# Patient Record
Sex: Male | Born: 1970 | Race: White | Hispanic: No | Marital: Single | Smoking: Current every day smoker
Health system: Southern US, Community
[De-identification: ages and names within clinical notes are randomized; demographics above are authoritative.]

## PROBLEM LIST (undated history)

## (undated) DIAGNOSIS — I10 Essential (primary) hypertension: Secondary | ICD-10-CM

## (undated) DIAGNOSIS — J939 Pneumothorax, unspecified: Secondary | ICD-10-CM

## (undated) DIAGNOSIS — T148XXA Other injury of unspecified body region, initial encounter: Secondary | ICD-10-CM

## (undated) HISTORY — PX: CHEST TUBE INSERTION: SHX231

---

## 2004-06-08 ENCOUNTER — Emergency Department (HOSPITAL_COMMUNITY): Admission: EM | Admit: 2004-06-08 | Discharge: 2004-06-08 | Payer: Self-pay | Admitting: Emergency Medicine

## 2005-04-18 ENCOUNTER — Emergency Department (HOSPITAL_COMMUNITY): Admission: EM | Admit: 2005-04-18 | Discharge: 2005-04-18 | Payer: Self-pay | Admitting: Emergency Medicine

## 2006-08-24 ENCOUNTER — Emergency Department (HOSPITAL_COMMUNITY): Admission: EM | Admit: 2006-08-24 | Discharge: 2006-08-25 | Payer: Self-pay | Admitting: Emergency Medicine

## 2007-06-23 ENCOUNTER — Encounter
Admission: RE | Admit: 2007-06-23 | Discharge: 2007-06-23 | Payer: Self-pay | Admitting: Physical Medicine & Rehabilitation

## 2007-11-13 ENCOUNTER — Emergency Department (HOSPITAL_COMMUNITY): Admission: EM | Admit: 2007-11-13 | Discharge: 2007-11-13 | Payer: Self-pay | Admitting: Family Medicine

## 2009-12-23 ENCOUNTER — Emergency Department (HOSPITAL_COMMUNITY): Admission: EM | Admit: 2009-12-23 | Discharge: 2009-12-23 | Payer: Self-pay | Admitting: Emergency Medicine

## 2017-05-23 ENCOUNTER — Encounter (INDEPENDENT_AMBULATORY_CARE_PROVIDER_SITE_OTHER): Payer: Self-pay

## 2017-05-23 ENCOUNTER — Encounter: Payer: Self-pay | Admitting: Neurology

## 2017-05-23 ENCOUNTER — Ambulatory Visit: Payer: Medicaid Other | Admitting: Neurology

## 2017-05-23 VITALS — BP 191/109 | HR 51 | Ht 72.0 in | Wt 221.0 lb

## 2017-05-23 DIAGNOSIS — IMO0002 Reserved for concepts with insufficient information to code with codable children: Secondary | ICD-10-CM

## 2017-05-23 DIAGNOSIS — G43709 Chronic migraine without aura, not intractable, without status migrainosus: Secondary | ICD-10-CM | POA: Diagnosis not present

## 2017-05-23 DIAGNOSIS — G8929 Other chronic pain: Secondary | ICD-10-CM | POA: Diagnosis not present

## 2017-05-23 DIAGNOSIS — M542 Cervicalgia: Secondary | ICD-10-CM

## 2017-05-23 MED ORDER — NORTRIPTYLINE HCL 25 MG PO CAPS: 50.0000 mg | ORAL_CAPSULE | Freq: Every day | ORAL | 11 refills | Status: DC

## 2017-05-23 MED ORDER — SUMATRIPTAN SUCCINATE 100 MG PO TABS: 100.0000 mg | ORAL_TABLET | Freq: Once | ORAL | 11 refills | Status: DC | PRN

## 2017-05-23 MED ORDER — TIZANIDINE HCL 4 MG PO TABS: 4.0000 mg | ORAL_TABLET | Freq: Four times a day (QID) | ORAL | 6 refills | Status: DC | PRN
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
## 2017-08-02 ENCOUNTER — Encounter (HOSPITAL_COMMUNITY): Admission: RE | Payer: Self-pay | Source: Ambulatory Visit

## 2017-08-02 ENCOUNTER — Ambulatory Visit (HOSPITAL_COMMUNITY): Admission: RE | Admit: 2017-08-02 | Payer: Medicaid Other | Source: Ambulatory Visit | Admitting: Oral Surgery

## 2017-08-02 SURGERY — DENTAL RESTORATION/EXTRACTIONS
Anesthesia: General | Laterality: Bilateral

## 2017-08-22 ENCOUNTER — Telehealth: Payer: Self-pay | Admitting: *Deleted

## 2017-08-22 ENCOUNTER — Ambulatory Visit: Payer: Medicaid Other | Admitting: Neurology

## 2017-08-22 NOTE — Telephone Encounter (Signed)
No showed follow up appointment. 

## 2017-08-26 ENCOUNTER — Encounter: Payer: Self-pay | Admitting: Neurology

## 2017-09-18 NOTE — Progress Notes (Signed)
Called pt for pre-op call. He stated that he is planning to cancel the surgery, doesn't want all the teeth pulled at one time. I instructed him to call Dr. Randa EvensJensen's office today.   EKG tracing from Dr. Unice BaileyEksir's office requested.  I've left a message for Sam, surgery scheduler at Dr. Randa EvensJensen's office to call me

## 2017-09-18 NOTE — Progress Notes (Signed)
Samantha at Dr. Randa EvensJensen's office returned my call, made her aware that pt was planning on cancelling. She states she will contact pt herself.

## 2017-09-18 NOTE — Progress Notes (Signed)
Kyle Mahoney Kyle Mahoney again at Dr. Randa EvensJensen's office to see if she had gotten in touch with [name]. She states she has tried with no luck. Still not sure if he's really cancelling surgery.

## 2017-09-19 ENCOUNTER — Encounter (HOSPITAL_COMMUNITY): Payer: Self-pay | Admitting: Certified Registered Nurse Anesthetist

## 2017-09-19 NOTE — Progress Notes (Signed)
Pt stated that he is not having surgery and the dentist is aware.

## 2017-09-20 ENCOUNTER — Ambulatory Visit (HOSPITAL_COMMUNITY): Admission: RE | Admit: 2017-09-20 | Payer: Medicaid Other | Source: Ambulatory Visit | Admitting: Oral Surgery

## 2017-09-20 SURGERY — DENTAL RESTORATION/EXTRACTIONS
Anesthesia: General | Laterality: Bilateral

## 2018-02-01 ENCOUNTER — Other Ambulatory Visit: Payer: Self-pay

## 2018-02-01 ENCOUNTER — Emergency Department (HOSPITAL_COMMUNITY)
Admission: EM | Admit: 2018-02-01 | Discharge: 2018-02-01 | Disposition: A | Discharge status: Home / Self Care | Payer: Medicaid Other | Attending: Emergency Medicine | Admitting: Emergency Medicine

## 2018-02-01 ENCOUNTER — Encounter (HOSPITAL_COMMUNITY): Payer: Self-pay | Admitting: Pharmacy Technician

## 2018-02-01 ENCOUNTER — Emergency Department (HOSPITAL_COMMUNITY): Payer: Medicaid Other

## 2018-02-01 DIAGNOSIS — I10 Essential (primary) hypertension: Secondary | ICD-10-CM | POA: Insufficient documentation

## 2018-02-01 DIAGNOSIS — F17200 Nicotine dependence, unspecified, uncomplicated: Secondary | ICD-10-CM | POA: Diagnosis not present

## 2018-02-01 DIAGNOSIS — R600 Localized edema: Secondary | ICD-10-CM | POA: Insufficient documentation

## 2018-02-01 DIAGNOSIS — M549 Dorsalgia, unspecified: Secondary | ICD-10-CM | POA: Insufficient documentation

## 2018-02-01 DIAGNOSIS — Z79899 Other long term (current) drug therapy: Secondary | ICD-10-CM | POA: Diagnosis not present

## 2018-02-01 DIAGNOSIS — G5631 Lesion of radial nerve, right upper limb: Secondary | ICD-10-CM | POA: Insufficient documentation

## 2018-02-01 DIAGNOSIS — M542 Cervicalgia: Secondary | ICD-10-CM | POA: Insufficient documentation

## 2018-02-01 DIAGNOSIS — R0602 Shortness of breath: Secondary | ICD-10-CM | POA: Diagnosis not present

## 2018-02-01 DIAGNOSIS — R609 Edema, unspecified: Secondary | ICD-10-CM

## 2018-02-01 HISTORY — DX: Essential (primary) hypertension: I10

## 2018-02-01 LAB — CBC WITH DIFFERENTIAL/PLATELET
ABS IMMATURE GRANULOCYTES: 0.03 10*3/uL (ref 0.00–0.07)
BASOS PCT: 0 %
Basophils Absolute: 0 10*3/uL (ref 0.0–0.1)
EOS ABS: 0.1 10*3/uL (ref 0.0–0.5)
Eosinophils Relative: 1 %
HEMATOCRIT: 48.3 % (ref 39.0–52.0)
Hemoglobin: 16.4 g/dL (ref 13.0–17.0)
Immature Granulocytes: 0 %
Lymphocytes Relative: 18 %
Lymphs Abs: 1.6 10*3/uL (ref 0.7–4.0)
MCH: 29.9 pg (ref 26.0–34.0)
MCHC: 34 g/dL (ref 30.0–36.0)
MCV: 88 fL (ref 80.0–100.0)
MONO ABS: 0.6 10*3/uL (ref 0.1–1.0)
Monocytes Relative: 7 %
Neutro Abs: 6.3 10*3/uL (ref 1.7–7.7)
Neutrophils Relative %: 74 %
PLATELETS: 236 10*3/uL (ref 150–400)
RBC: 5.49 MIL/uL (ref 4.22–5.81)
RDW: 11.8 % (ref 11.5–15.5)
WBC: 8.7 10*3/uL (ref 4.0–10.5)
nRBC: 0 % (ref 0.0–0.2)

## 2018-02-01 LAB — COMPREHENSIVE METABOLIC PANEL
ALBUMIN: 3.8 g/dL (ref 3.5–5.0)
ALK PHOS: 110 U/L (ref 38–126)
ALT: 30 U/L (ref 0–44)
AST: 24 U/L (ref 15–41)
Anion gap: 9 (ref 5–15)
BUN: 13 mg/dL (ref 6–20)
CALCIUM: 9.5 mg/dL (ref 8.9–10.3)
CO2: 27 mmol/L (ref 22–32)
Chloride: 104 mmol/L (ref 98–111)
Creatinine, Ser: 1.06 mg/dL (ref 0.61–1.24)
GFR calc Af Amer: >60 mL/min (ref >60)
GFR calc non Af Amer: >60 mL/min (ref >60)
GLUCOSE: 124 mg/dL — AB (ref 70–99)
Potassium: 4 mmol/L (ref 3.5–5.1)
Sodium: 140 mmol/L (ref 135–145)
TOTAL PROTEIN: 6.8 g/dL (ref 6.5–8.1)
Total Bilirubin: 0.8 mg/dL (ref 0.3–1.2)

## 2018-02-01 LAB — BRAIN NATRIURETIC PEPTIDE: B Natriuretic Peptide: 90.7 pg/mL (ref 0.0–100.0)

## 2018-02-01 NOTE — Discharge Instructions (Addendum)
Elevate your legs above your heart as much as possible.  No evidence of heart failure, kidney failure or liver failure from today's tests. call to schedule an appointment with your primary care provider if leg swelling not improved in a week or if the ability to bend your wrist is not improved in 1 to 2 weeks.  Ask your primary care provider to help you to stop smoking

## 2018-02-01 NOTE — ED Provider Notes (Signed)
[facility] EMERGENCY DEPARTMENT Provider Note   CSN: [id number] Arrival date & time: 02/01/18  1645     History   Chief Complaint Chief Complaint  Patient presents with  . Leg Swelling    HPI Kyle Mahoney is a 47 y.o. male Complains of bilateral leg swelling intermittently for the past 30 to 45 days.  Swelling is worse when he has his feet in a dependent position and improved with elevating his legs.  He also reports intermittent shortness of breath over weeks.[name]  He denies shortness of breath presently..  Denies any chest pain.  No other associated symptoms.  He does suffer from chronic back pain from several months ago when he "bent over to pick up a log" he treated himself with oxycodone from time to time which is prescribed for him chronic neck pain he reports that he sleeps with one pillow and sometimes sleeps in a recliner, reporting that he just falls asleep late at night sometimes HPI  Past Medical History:  Diagnosis Date  . Hypertension     Patient Active Problem List   Diagnosis Date Noted  . Chronic neck pain 05/23/2017  . Chronic migraine 05/23/2017    History reviewed. No pertinent surgical history.      Home Medications    Prior to Admission medications   Medication Sig Start Date End Date Taking? Authorizing Provider  ALPRAZolam (XANAX) 0.5 MG tablet TAKE 1 TABLET BY MOUTH AT NIGHT AS NEEDED FOR SLEEP 04/17/17   [provider]  amLODipine (NORVASC) 10 MG tablet Take 10 mg by mouth daily.    [provider]  metoprolol (TOPROL-XL) 200 MG 24 hr tablet Take 200 mg by mouth daily. 09/04/17   [provider]  nortriptyline (PAMELOR) 25 MG capsule Take 2 capsules (50 mg total) by mouth at bedtime. Patient not taking: Reported on 09/16/2017 05/23/17   Levert Feinstein, MD  oxyCODONE-acetaminophen (PERCOCET) 10-325 MG tablet Take 1 tablet by mouth daily as needed for pain.  08/21/17   [provider]  SUMAtriptan  (IMITREX) 100 MG tablet Take 1 tablet (100 mg total) by mouth once as needed for up to 1 dose for migraine. May repeat in 2 hours if headache persists or recurs. Patient not taking: Reported on 09/16/2017 05/23/17   Levert Feinstein, MD  tiZANidine (ZANAFLEX) 4 MG tablet Take 1 tablet (4 mg total) by mouth every 6 (six) hours as needed for muscle spasms. Patient not taking: Reported on 09/16/2017 05/23/17   Levert Feinstein, MD    Family History No family history on file.  Social History Social History   Tobacco Use  . Smoking status: Current Every Day Smoker    Packs/day: 1.00  . Smokeless tobacco: Never Used  Substance Use Topics  . Alcohol use: Yes  . Drug use: Never   Drinks at least 1/5 of liquor per week  Allergies   Patient has no known allergies.   Review of Systems Review of Systems  Constitutional: Negative.   HENT: Negative.   Respiratory: Positive for shortness of breath.        No shortness of breath presently  Cardiovascular: Positive for leg swelling.  Gastrointestinal: Negative.   Musculoskeletal: Positive for back pain and neck pain.       Chronic neck neck and back pain  Skin: Negative.   Neurological: Negative.   Psychiatric/Behavioral: Negative.   All other systems reviewed and are negative.    Physical Exam Updated Vital Signs BP [name])  158/96   Pulse 79   Temp (!) 97.5 F (36.4 C) (Oral)   Resp 20   Ht 5\' 10"  (1.778 m)   Wt 90.7 kg   SpO2 100%   BMI 28.70 kg/m   Physical Exam Vitals signs and nursing note reviewed.  Constitutional:      General: He is not in acute distress.    Appearance: He is well-developed.  HENT:     Head: Normocephalic and atraumatic.     Comments: Poor dentition diffusely Eyes:     Conjunctiva/sclera: Conjunctivae normal.     Pupils: Pupils are equal, round, and reactive to light.  Neck:     Musculoskeletal: Neck supple.     Thyroid: No thyromegaly.     Vascular: No carotid bruit.     Trachea: No tracheal deviation.      Comments: No JVD Cardiovascular:     Rate and Rhythm: Normal rate and regular rhythm.     Heart sounds: No murmur.  Pulmonary:     Effort: Pulmonary effort is normal. No respiratory distress.     Breath sounds: Normal breath sounds.  Abdominal:     General: Bowel sounds are normal. There is no distension.     Palpations: Abdomen is soft.     Tenderness: There is no abdominal tenderness.  Musculoskeletal: Normal range of motion.        General: No tenderness.     Comments: Trace to 1+ pretibial pitting edema bilaterally DP pulses 2+ bilaterally  Skin:    General: Skin is warm and dry.     Findings: No rash.  Neurological:     Mental Status: He is alert and oriented to person, place, and time.     Coordination: Coordination normal.     Results for orders placed or performed during the hospital encounter of 02/01/18  Comprehensive metabolic panel  Result Value Ref Range   Sodium 140 135 - 145 mmol/L   Potassium 4.0 3.5 - 5.1 mmol/L   Chloride 104 98 - 111 mmol/L   CO2 27 22 - 32 mmol/L   Glucose, Bld 124 (H) 70 - 99 mg/dL   BUN 13 6 - 20 mg/dL   Creatinine, Ser 9.601.06 0.61 - 1.24 mg/dL   Calcium 9.5 8.9 - 45.410.3 mg/dL   Total Protein 6.8 6.5 - 8.1 g/dL   Albumin 3.8 3.5 - 5.0 g/dL   AST 24 15 - 41 U/L   ALT 30 0 - 44 U/L   Alkaline Phosphatase 110 38 - 126 U/L   Total Bilirubin 0.8 0.3 - 1.2 mg/dL   GFR calc non Af Amer >60 >60 mL/min   GFR calc Af Amer >60 >60 mL/min   Anion gap 9 5 - 15  CBC with Differential/Platelet  Result Value Ref Range   WBC 8.7 4.0 - 10.5 K/uL   RBC 5.49 4.22 - 5.81 MIL/uL   Hemoglobin 16.4 13.0 - 17.0 g/dL   HCT 09.848.3 11.939.0 - 14.752.0 %   MCV 88.0 80.0 - 100.0 fL   MCH 29.9 26.0 - 34.0 pg   MCHC 34.0 30.0 - 36.0 g/dL   RDW 82.911.8 56.211.5 - 13.015.5 %   Platelets 236 150 - 400 K/uL   nRBC 0.0 0.0 - 0.2 %   Neutrophils Relative % 74 %   Neutro Abs 6.3 1.7 - 7.7 K/uL   Lymphocytes Relative 18 %   Lymphs Abs 1.6 0.7 - 4.0 K/uL   Monocytes Relative 7 %  Monocytes Absolute 0.6 0.1 - 1.0 K/uL   Eosinophils Relative 1 %   Eosinophils Absolute 0.1 0.0 - 0.5 K/uL   Basophils Relative 0 %   Basophils Absolute 0.0 0.0 - 0.1 K/uL   Immature Granulocytes 0 %   Abs Immature Granulocytes 0.03 0.00 - 0.07 K/uL  Brain natriuretic peptide  Result Value Ref Range   B Natriuretic Peptide 90.7 0.0 - 100.0 pg/mL   Dg Chest 2 View  Result Date: 02/01/2018 CLINICAL DATA:  Bilateral lower extremity swelling and pain. EXAM: CHEST - 2 VIEW COMPARISON:  None. FINDINGS: Lungs are hyperexpanded. The lungs are clear without focal pneumonia, edema, pneumothorax or pleural effusion. The cardiopericardial silhouette is within normal limits for size. The visualized bony structures of the thorax are intact. IMPRESSION: Hyperexpansion without acute findings. Electronically Signed   By: Kennith CenterEric  Mansell M.D.   On: 02/01/2018 18:13   ED Treatments / Results  Labs (all labs ordered are listed, but only abnormal results are displayed) Labs Reviewed - No data to display  EKG None  Radiology No results found.  Procedures Procedures (including critical care time)  Medications Ordered in ED Medications - No data to display   Initial Impression / Assessment and Plan / ED Course  I have reviewed the triage vital signs and the nursing notes.  Pertinent labs & imaging results that were available during my care of the patient were reviewed by me and considered in my medical decision making (see chart for details).   Lab work unremarkable.  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  I suggest he follow-up with PMD I will feel the patient requires diuretics.  Peripheral leg edema is minor.  No signs of congestive heart  failure, renal failure or hepatic failure.  I suggest elevation of legs.  I also counseled patient for 5 minutes on smoking cessation Final Clinical Impressions(s) / ED Diagnoses  #1 peripheral edema #2 radial nerve palsy #3 tobacco abuse Final diagnoses:  None    ED Discharge Orders    None       Doug SouJacubowitz, Brianne Maina, MD 02/01/18 1907

## 2018-02-01 NOTE — ED Triage Notes (Signed)
Pt arrives via pov with reports of back pain, BLE edema and sob. 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 Pt states over the last 3 weeks his ankles are swelling and he has been SOB while sitting.

## 2018-02-01 NOTE — ED Notes (Signed)
Patient verbalizes understanding of discharge instructions. Opportunity for questioning and answers were provided. 

## 2018-02-06 ENCOUNTER — Other Ambulatory Visit (HOSPITAL_COMMUNITY): Payer: Self-pay | Admitting: Family Medicine

## 2018-02-06 ENCOUNTER — Ambulatory Visit (HOSPITAL_COMMUNITY)
Admission: RE | Admit: 2018-02-06 | Discharge: 2018-02-06 | Disposition: A | Discharge status: Home / Self Care | Payer: Medicaid Other | Source: Ambulatory Visit | Attending: Family Medicine | Admitting: Family Medicine

## 2018-02-06 DIAGNOSIS — M79604 Pain in right leg: Secondary | ICD-10-CM

## 2018-02-06 DIAGNOSIS — M79605 Pain in left leg: Secondary | ICD-10-CM

## 2018-02-06 DIAGNOSIS — M7989 Other specified soft tissue disorders: Secondary | ICD-10-CM | POA: Diagnosis present

## 2018-02-06 NOTE — Progress Notes (Signed)
Right lower extremity venous duplex has been completed. Negative for DVT. [facility] Bailey[facility]ksir's office.  02/06/18 11:03 AM Olen CordialGreg Avarae Zwart RVT

## 2018-04-07 ENCOUNTER — Encounter: Payer: Self-pay | Admitting: Cardiology

## 2018-04-07 ENCOUNTER — Ambulatory Visit (INDEPENDENT_AMBULATORY_CARE_PROVIDER_SITE_OTHER): Payer: Medicaid Other | Admitting: Cardiology

## 2018-04-07 ENCOUNTER — Ambulatory Visit: Payer: Self-pay | Admitting: Cardiology

## 2018-04-07 VITALS — BP 129/73 | HR 63 | Ht 70.0 in | Wt 209.0 lb

## 2018-04-07 DIAGNOSIS — R600 Localized edema: Secondary | ICD-10-CM | POA: Diagnosis not present

## 2018-04-07 DIAGNOSIS — I10 Essential (primary) hypertension: Secondary | ICD-10-CM

## 2018-04-07 DIAGNOSIS — R0609 Other forms of dyspnea: Secondary | ICD-10-CM

## 2018-04-07 DIAGNOSIS — I493 Ventricular premature depolarization: Secondary | ICD-10-CM

## 2018-04-07 DIAGNOSIS — F17200 Nicotine dependence, unspecified, uncomplicated: Secondary | ICD-10-CM

## 2018-04-07 NOTE — Progress Notes (Signed)
Subjective:   Kyle Mahoney, male    DOB: Jun 25, 1970, 48 y.o.   MRN: [id number]  Roger KillWilliams, Breejante J, PA-C:  Chief Complaint  Patient presents with  . Palpitations    HPI: Kyle MasterJoseph A Cato  is a 48 y.o. male  with hypertension, tobacco use disorder recently evaluated by us for palpitations, chest pain, and leg edema. He underwent lexiscan nuclear stress test, echocardiogram, and event monitor and now presents for follow up.   Leg edema was felt to be related to amlodipine and has improved; however, he continues to have intermittent leg swelling. Since last seen by us, he has not noticed any significant chest pain and palpitations have slightly improved, but does continue to have occasional episodes where he feels that his heart is racing. Dyspnea on exertion is unchanged.   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 His arm function is slowly improving. He has not had anymore syncopal episodes and he feels that he did not actually pass out.   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  He does currently smoke 1/2 pack per day, working to quit. Previously smoked 2 packs per day since 2005. He does occasionally drink alcohol. 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  Past Medical History:  Diagnosis Date  . Hypertension     History reviewed. No pertinent surgical history.  History reviewed. No pertinent family history.  Social History   Socioeconomic History  . Marital status: Single    Spouse name: Not on file  . Number of children: Not on file  . Years of education: Not on file  . Highest education level: Not on file  Occupational History  . Not on file  Social Needs  . Financial resource strain: Not on file  . Food insecurity:    Worry: Not on file    Inability: Not on file  . Transportation needs:    Medical: Not on file    Non-medical: Not on file  Tobacco Use  . Smoking status: Current Every Day Smoker    Packs/day: 1.00  . Smokeless tobacco: Never Used  Substance and Sexual Activity  . Alcohol use: Yes  . Drug use: Never  . Sexual activity: Not on file  Lifestyle  . Physical activity:    Days per week: Not on file    Minutes per session: Not on file  . Stress: Not on file  Relationships  . Social connections:    Talks on phone: Not on file    Gets together: Not on file    Attends religious service: Not on file    Active member of club or organization: Not on file    Attends meetings of clubs or organizations: Not on file    Relationship status: Not on file  . Intimate partner violence:    Fear of current or ex partner: Not on file    Emotionally abused: Not on file    Physically abused: Not on file    Forced sexual activity: Not on file  Other Topics Concern  . Not on file  Social History Narrative  . Not on file    Current Meds  Medication Sig  . ALPRAZolam (XANAX) 0.5 MG tablet TAKE 1 TABLET BY MOUTH AT NIGHT AS NEEDED FOR SLEEP  .  cyclobenzaprine (FLEXERIL) 10 MG tablet Take 1 tablet by mouth daily.  . furosemide (LASIX) 20 MG tablet Take 1-2 tablets by mouth daily.  [name] lisinopril (PRINIVIL,ZESTRIL) 40 MG tablet Take 40 mg by mouth daily.  . metoprolol (TOPROL-XL) 200 MG 24 hr tablet Take 200 mg by mouth daily.  . MULTIPLE VITAMIN PO Take 1 tablet by mouth daily.  [name] oxyCODONE-acetaminophen (PERCOCET) 10-325 MG tablet Take 1 tablet by mouth daily as needed for pain.      Review of Systems  Constitution: Negative for decreased appetite, malaise/fatigue, weight gain and weight loss.  Eyes: Negative for visual disturbance.  Cardiovascular: Positive for leg swelling (occasional) and palpitations (occasional). Negative for chest pain, claudication, orthopnea and syncope.  Respiratory: Positive for shortness of breath. Negative for hemoptysis and wheezing.     Endocrine: Negative for cold intolerance and heat intolerance.  Hematologic/Lymphatic: Does not bruise/bleed easily.  Skin: Negative for nail changes.  Musculoskeletal: Negative for muscle weakness and myalgias.  Gastrointestinal: Negative for abdominal pain, change in bowel habit, nausea and vomiting.  Neurological: Negative for difficulty with concentration, dizziness, focal weakness and headaches.  Psychiatric/Behavioral: Negative for altered mental status and suicidal ideas.  All other systems reviewed and are negative.      Objective:     Blood pressure 129/73, pulse 63, height 5\' 10"  (1.778 m), weight 209 lb (94.8 kg), SpO2 100 %.  Echocardiogram 02/24/2018: Left ventricle cavity is normal in size. Mild concentric hypertrophy of the left ventricle. Normal global wall motion. Normal diastolic filling pattern. Calculated EF 63%. Mild tricuspid regurgitation. Estimated pulmonary artery systolic pressure 36 mmHg.  Lexiscan myoview stress test 02/24/2018: 1. Lexiscan stress test was performed. Exercise capacity was not assessed. Stress symptoms included chest pain, dizziness, headache, and palpitations. Peak effect blood pressure was 142/80 mmHg. Stress EKG is non diagnostic for ischemia as it is a pharmacologic stress. In addition, the stress electrocardiogram showed sinus tachycardia, normal stress conduction, frequent PVC's, and normal stress repolarization.  2. The overall quality of the study is good. There is no evidence of abnormal lung activity. Stress and rest SPECT images demonstrate homogeneous tracer distribution throughout the myocardium. Gated SPECT imaging reveals normal myocardial thickening and wall motion. The left ventricular ejection fraction was normal (66%).  3. Intermediate risk study due to frequent PVC's seen through out the study. Clinical correlation recommendation.  30 day event monitor 02/14/2018-03/15/2018: Normal sinus rhythm.  Symptoms correlated with  normal sinus rhythm with occasional PVCs. 4 episodes were sinus rhythm with trigeminal PVC's.  No A. fib or SVT occurred.  Physical Exam  Constitutional: He is oriented to person, place, and time. Vital signs are normal. He appears well-developed and well-nourished.  HENT:  Head: Normocephalic and atraumatic.  Neck: Normal range of motion.  Cardiovascular: Normal rate, regular rhythm, normal heart sounds and intact distal pulses.  Occasional extrasystoles are present.  Pulmonary/Chest: Effort normal and breath sounds normal. No accessory muscle usage. No respiratory distress.  Abdominal: Soft. Bowel sounds are normal.  Musculoskeletal: Normal range of motion.  Neurological: He is alert and oriented to person, place, and time.  Skin: Skin is warm and dry.  Vitals reviewed.          Assessment & Recommendations:   1. PVC (premature ventricular contraction) Event monitor results were discussed with the patient, he was noted to have occasional PVCs.  He also had a couple of episodes of trigeminal PVCs that correlated with his symptoms of flutter/rapid heartbeat.  He is noted  to have occasional ectopy on exam today. He is on high dose beta blocker. I have discussed life threatening vs life altering. Etiology for PVC's were discussed. Encouraged him to avoid caffeine, controlling stress, regular exercise, etc. I will check a TSH level as this does not appear to have been performed for possible etiology.   2. Tobacco use disorder Tobacco use cessation:  Cigarette smoke contains a deadly mix of more than 7,000 chemicals; hundreds are toxic and about 70 can cause cancer. Cigarette smoke can cause serious health problems, numerous diseases, and death. Apart from reducing cancer and COPD deaths, Smoking cessation reduces the risk for coronary heart disease, stroke, and peripheral vascular disease. Coronary heart disease risk is substantially reduced within 1 to 2 years of cessation. Patient is  currently working to quit and will try to continue to work towards this on his own. Will further discuss medications at his next office visit.   3. Essential hypertension Blood pressure is elevated today, but generally well controlled. Will continue to monitor. Discussed avoiding salt in his diet.   4. Bilateral leg edema Has improved since stopping amlodipine; however, he continues to have intermittent episodes. Echocardiogram results were discussed with the patient, no systolic or diastolic dysfunction. Has mildly elevated PA pressure by echo that is likely related to tobacco use and underlying COPD. I cannot explain is intermittent episodes of swelling. I have asked him to pay attention to when leg swelling occurs as to what he has eating prior to this to see if this is related. No evidence of venous insufficiency and symptoms are not suggestive of this.   5. Dyspnea on exertion Echo and stress test results were discussed. No evidence of ischemia by stress test. I suspect that dyspnea is related to ongoing tobacco use and deconditioning. Encouraged him to start regular cardio exercises and also continue to work on smoking cessation to see if symptoms will improve.    I will notify him of TSH results. I will see him back in 3 months to follow up on PVC's, dyspnea on exertion, and tobacco cessation.    Altamese Ryegate, FNP-C [facility] Office: [phone] Fax: [phone]

## 2018-04-12 ENCOUNTER — Encounter: Payer: Self-pay | Admitting: Cardiology

## 2018-04-12 DIAGNOSIS — F17200 Nicotine dependence, unspecified, uncomplicated: Secondary | ICD-10-CM | POA: Insufficient documentation

## 2018-04-12 DIAGNOSIS — I493 Ventricular premature depolarization: Secondary | ICD-10-CM | POA: Insufficient documentation

## 2018-04-12 DIAGNOSIS — R0609 Other forms of dyspnea: Secondary | ICD-10-CM | POA: Insufficient documentation

## 2018-04-12 DIAGNOSIS — I10 Essential (primary) hypertension: Secondary | ICD-10-CM | POA: Insufficient documentation

## 2018-04-12 DIAGNOSIS — R600 Localized edema: Secondary | ICD-10-CM | POA: Insufficient documentation

## 2018-07-08 ENCOUNTER — Ambulatory Visit: Payer: Medicaid Other | Admitting: Cardiology

## 2018-08-06 NOTE — Progress Notes (Deleted)
Primary Physician:  Roger KillWilliams, Breejante J, PA-C   Patient ID: Kyle MasterJoseph A Boxwell, male    DOB: 02/21/1970, 48 y.o.   MRN: [id number]  Subjective:    No chief complaint on file.   HPI: Kyle Mahoney  is a 48 y.o. male  with hypertension, tobacco use disorder with occasional PVC's and leg edema. Underwent lexiscan nuclear stress test in Jan 2020 that was considered intermediate risk in view of frequent PVC's. 30 day event monitor showed correlation of symptoms of palpitations with occasional PVC's. Echocardiogram also performed in Jan 2020 was essentially normal.   This is a 3 month office visit. Leg edema improved with stopping amlodipine and also being careful with his diet. He does continue to have occasional palpitations. TSH is normal.   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 His arm function is slowly improving. He has not had anymore syncopal episodes and he feels that he did not actually pass out.   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  He does currently smoke 1/2 pack per day, working to quit. Previously smoked 2 packs per day since 2005. He does occasionally drink alcohol. 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  Past Medical History:  Diagnosis Date  . Hypertension     No past surgical history on file.  Social History   Socioeconomic History  . Marital status: Single    Spouse name: Not on file  . Number of children: Not on file  . Years of education: Not on file  . Highest education level: Not on file  Occupational History  . Not on file  Social Needs  . Financial resource strain: Not on file  . Food insecurity    Worry: Not on file    Inability: Not on file  . Transportation needs    Medical: Not on file    Non-medical: Not on file  Tobacco Use  . Smoking status: Current Every Day  Smoker    Packs/day: 1.00  . Smokeless tobacco: Never Used  Substance and Sexual Activity  . Alcohol use: Yes  . Drug use: Never  . Sexual activity: Not on file  Lifestyle  . Physical activity    Days per week: Not on file    Minutes per session: Not on file  . Stress: Not on file  Relationships  . Social Musicianconnections    Talks on phone: Not on file    Gets together: Not on file    Attends religious service: Not on file    Active member of club or organization: Not on file    Attends meetings of clubs or organizations: Not on file    Relationship status: Not on file  . Intimate partner violence    Fear of current or ex partner: Not on file    Emotionally abused: Not on file    Physically abused: Not on file    Forced sexual activity: Not on file  Other Topics Concern  . Not on file  Social History Narrative  . Not on file    Review of Systems  Constitution: Negative for decreased appetite, malaise/fatigue, weight gain and weight loss.  Eyes: Negative for visual disturbance.  Cardiovascular: Positive for leg swelling (occasional) and palpitations (occasional). Negative for chest pain, claudication, orthopnea and syncope.  Respiratory:  Positive for shortness of breath. Negative for hemoptysis and wheezing.   Endocrine: Negative for cold intolerance and heat intolerance.  Hematologic/Lymphatic: Does not bruise/bleed easily.  Skin: Negative for nail changes.  Musculoskeletal: Negative for muscle weakness and myalgias.  Gastrointestinal: Negative for abdominal pain, change in bowel habit, nausea and vomiting.  Neurological: Negative for difficulty with concentration, dizziness, focal weakness and headaches.  Psychiatric/Behavioral: Negative for altered mental status and suicidal ideas.  All other systems reviewed and are negative.     Objective:  There were no vitals taken for this visit. There is no height or weight on file to calculate BMI.    Physical Exam   Constitutional: He is oriented to person, place, and time. Vital signs are normal. He appears well-developed and well-nourished.  HENT:  Head: Normocephalic and atraumatic.  Neck: Normal range of motion.  Cardiovascular: Normal rate, regular rhythm, normal heart sounds and intact distal pulses.  Occasional extrasystoles are present.  Pulmonary/Chest: Effort normal and breath sounds normal. No accessory muscle usage. No respiratory distress.  Abdominal: Soft. Bowel sounds are normal.  Musculoskeletal: Normal range of motion.  Neurological: He is alert and oriented to person, place, and time.  Skin: Skin is warm and dry.  Vitals reviewed.  Radiology: No results found.  Laboratory examination:    CMP Latest Ref Rng & Units 02/01/2018  Glucose 70 - 99 mg/dL 124(H)  BUN 6 - 20 mg/dL 13  Creatinine 0.61 - 1.24 mg/dL 1.06  Sodium 135 - 145 mmol/L 140  Potassium 3.5 - 5.1 mmol/L 4.0  Chloride 98 - 111 mmol/L 104  CO2 22 - 32 mmol/L 27  Calcium 8.9 - 10.3 mg/dL 9.5  Total Protein 6.5 - 8.1 g/dL 6.8  Total Bilirubin 0.3 - 1.2 mg/dL 0.8  Alkaline Phos 38 - 126 U/L 110  AST 15 - 41 U/L 24  ALT 0 - 44 U/L 30   CBC Latest Ref Rng & Units 02/01/2018  WBC 4.0 - 10.5 K/uL 8.7  Hemoglobin 13.0 - 17.0 g/dL 16.4  Hematocrit 39.0 - 52.0 % 48.3  Platelets 150 - 400 K/uL 236   Lipid Panel  No results found for: CHOL, TRIG, HDL, CHOLHDL, VLDL, LDLCALC, LDLDIRECT HEMOGLOBIN A1C No results found for: HGBA1C, MPG TSH No results for input(s): TSH in the last 8760 hours.  PRN Meds:. There are no discontinued medications. No outpatient medications have been marked as taking for the 08/07/18 encounter (Appointment) with Miquel Dunn, NP.    Cardiac Studies:   Echocardiogram 02/24/2018: Left ventricle cavity is normal in size. Mild concentric hypertrophy of the left ventricle. Normal global wall motion. Normal diastolic filling pattern. Calculated EF 63%. Mild tricuspid regurgitation.  Estimated pulmonary artery systolic pressure 36 mmHg.  Lexiscan myoview stress test 02/24/2018: 1. Lexiscan stress test was performed. Exercise capacity was not assessed. Stress symptoms included chest pain, dizziness, headache, and palpitations. Peak effect blood pressure was 142/80 mmHg. Stress EKG is non diagnostic for ischemia as it is a pharmacologic stress. In addition, the stress electrocardiogram showed sinus tachycardia, normal stress conduction, frequent PVC's, and normal stress repolarization.  2. The overall quality of the study is good. There is no evidence of abnormal lung activity. Stress and rest SPECT images demonstrate homogeneous tracer distribution throughout the myocardium. Gated SPECT imaging reveals normal myocardial thickening and wall motion. The left ventricular ejection fraction was normal (66%).  3. Intermediate risk study due to frequent PVC's seen through out the study. Clinical correlation recommendation.  30 day  event monitor 02/14/2018-03/15/2018: Normal sinus rhythm.  Symptoms correlated with normal sinus rhythm with occasional PVCs. 4 episodes were sinus rhythm with trigeminal PVC's.  No A. fib or SVT occurred.  Assessment:   No diagnosis found.  ***  Recommendations:   ***  Toniann FailAshton Haynes Lyndal Reggio, MSN, APRN, FNP-C [facility] [place] Office: [phone] Fax: [phone]

## 2018-08-07 ENCOUNTER — Ambulatory Visit: Payer: Medicaid Other | Admitting: Cardiology

## 2018-09-11 ENCOUNTER — Encounter: Payer: Self-pay | Admitting: Cardiology

## 2018-09-15 ENCOUNTER — Encounter: Payer: Self-pay | Admitting: Cardiology

## 2018-09-15 ENCOUNTER — Other Ambulatory Visit: Payer: Self-pay

## 2018-09-15 ENCOUNTER — Ambulatory Visit (INDEPENDENT_AMBULATORY_CARE_PROVIDER_SITE_OTHER): Payer: Self-pay | Admitting: Cardiology

## 2018-09-15 VITALS — BP 121/78 | HR 71 | Ht 72.0 in | Wt 200.0 lb

## 2018-09-15 DIAGNOSIS — I493 Ventricular premature depolarization: Secondary | ICD-10-CM

## 2018-09-15 DIAGNOSIS — R0609 Other forms of dyspnea: Secondary | ICD-10-CM | POA: Diagnosis not present

## 2018-09-15 DIAGNOSIS — I10 Essential (primary) hypertension: Secondary | ICD-10-CM | POA: Diagnosis not present

## 2018-09-15 DIAGNOSIS — I872 Venous insufficiency (chronic) (peripheral): Secondary | ICD-10-CM

## 2018-09-15 DIAGNOSIS — I739 Peripheral vascular disease, unspecified: Secondary | ICD-10-CM

## 2018-09-15 DIAGNOSIS — F17200 Nicotine dependence, unspecified, uncomplicated: Secondary | ICD-10-CM

## 2018-09-15 NOTE — Progress Notes (Signed)
Primary Physician:  Roger KillWilliams, Breejante J, PA-C   Patient ID: Kyle Mahoney, male    DOB: 1970/04/02, 48 y.o.   MRN: [id number]  Subjective:    Chief Complaint  Patient presents with   Edema   Follow-up    HPI: Kyle Mahoney  is a 48 y.o. male  with hypertension, tobacco use disorder initially evaluated by us for palpitations, chest pain, and leg edema. He underwent lexiscan nuclear stress test in Jan 2020 that showed normal perfusion, frequent PVC's; therefore, considered intermediate risk study. Echocardiogram revealed normal LVEF. Had occasional PVC's on 30 day event monitor.   Since last seen by me 6 months ago, he has had worsening leg edema. Initially felt to be related to amlodipine; however, has continued despite discontinuing this. Leg edema improves with leg elevation and avoiding salt. He has now noticed brown spots on his legs. He does have some tightness and pain in his legs with walking long distances.   He has not noticed any chest discomfort. He does have dyspnea with over exertion that is unchanged. He has started exercising at the gym and has notice heart racing with this and pounding sensation. He has not noticed palpitations outside of trying to exercise. Symptoms resolve with stopping to rest.   He does continue to smoke 0.5 packs per day. Admits to drinking more alcohol recently. States that he has recently had some personal stress that has contributed to this. 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    Past Medical History:  Diagnosis Date   Hypertension     History reviewed. No pertinent surgical history.  Social History   Socioeconomic History   Marital status: Single    Spouse name: Not on file   Number of children: 2   Years of education: Not on file   Highest education level: Not on file  Occupational History   Not on file  Social Needs   Financial resource  strain: Not on file   Food insecurity    Worry: Not on file    Inability: Not on file   Transportation needs    Medical: Not on file    Non-medical: Not on file  Tobacco Use   Smoking status: Current Every Day Smoker    Packs/day: 0.50    Types: Cigarettes   Smokeless tobacco: Never Used  Substance and Sexual Activity   Alcohol use: Yes    Comment: occ   Drug use: Never   Sexual activity: Not on file  Lifestyle   Physical activity    Days per week: Not on file    Minutes per session: Not on file   Stress: Not on file  Relationships   Social connections    Talks on phone: Not on file    Gets together: Not on file    Attends religious service: Not on file    Active member of club or organization: Not on file    Attends meetings of clubs or organizations: Not on file    Relationship status: Not on file   Intimate partner violence    Fear of current or ex partner: Not on file    Emotionally abused: Not on file    Physically abused: Not on file    Forced sexual activity: Not on file  Other Topics Concern   Not on file  Social History Narrative   Not on file  Review of Systems  Constitution: Negative for decreased appetite, malaise/fatigue, weight gain and weight loss.  Eyes: Negative for visual disturbance.  Cardiovascular: Positive for leg swelling (occasional) and palpitations (occasional). Negative for chest pain, claudication, orthopnea and syncope.  Respiratory: Positive for shortness of breath. Negative for hemoptysis and wheezing.   Endocrine: Negative for cold intolerance and heat intolerance.  Hematologic/Lymphatic: Does not bruise/bleed easily.  Skin: Negative for nail changes.  Musculoskeletal: Negative for muscle weakness and myalgias.  Gastrointestinal: Negative for abdominal pain, change in bowel habit, nausea and vomiting.  Neurological: Negative for difficulty with concentration, dizziness, focal weakness and headaches.    Psychiatric/Behavioral: Negative for altered mental status and suicidal ideas.  All other systems reviewed and are negative.     Objective:  Blood pressure 121/78, pulse 71, height 6' (1.829 m), weight 200 lb (90.7 kg), SpO2 100 %. Body mass index is 27.12 kg/m.    Physical Exam  Constitutional: He is oriented to person, place, and time. Vital signs are normal. He appears well-developed and well-nourished.  HENT:  Head: Normocephalic and atraumatic.  Neck: Normal range of motion.  Cardiovascular: Normal rate, regular rhythm, normal heart sounds and intact distal pulses.  Occasional extrasystoles are present.  2+ leg edema bilateral Increased pigmentation  Pulmonary/Chest: Effort normal and breath sounds normal. No accessory muscle usage. No respiratory distress.  Abdominal: Soft. Bowel sounds are normal.  Musculoskeletal: Normal range of motion.  Neurological: He is alert and oriented to person, place, and time.  Skin: Skin is warm and dry.  Vitals reviewed.  Radiology: No results found.  Laboratory examination:    CMP Latest Ref Rng & Units 02/01/2018  Glucose 70 - 99 mg/dL 161(W124(H)  BUN 6 - 20 mg/dL 13  Creatinine 9.600.61 - 4.541.24 mg/dL 0.981.06  Sodium 119135 - 147145 mmol/L 140  Potassium 3.5 - 5.1 mmol/L 4.0  Chloride 98 - 111 mmol/L 104  CO2 22 - 32 mmol/L 27  Calcium 8.9 - 10.3 mg/dL 9.5  Total Protein 6.5 - 8.1 g/dL 6.8  Total Bilirubin 0.3 - 1.2 mg/dL 0.8  Alkaline Phos 38 - 126 U/L 110  AST 15 - 41 U/L 24  ALT 0 - 44 U/L 30   CBC Latest Ref Rng & Units 02/01/2018  WBC 4.0 - 10.5 K/uL 8.7  Hemoglobin 13.0 - 17.0 g/dL 82.916.4  Hematocrit 56.239.0 - 52.0 % 48.3  Platelets 150 - 400 K/uL 236   Lipid Panel  No results found for: CHOL, TRIG, HDL, CHOLHDL, VLDL, LDLCALC, LDLDIRECT HEMOGLOBIN A1C No results found for: HGBA1C, MPG TSH No results for input(s): TSH in the last 8760 hours.  PRN Meds:. Medications Discontinued During This Encounter  Medication Reason   ALPRAZolam  (XANAX) 0.5 MG tablet    cyclobenzaprine (FLEXERIL) 10 MG tablet    lisinopril (PRINIVIL,ZESTRIL) 40 MG tablet    Current Meds  Medication Sig   furosemide (LASIX) 20 MG tablet Take 1-2 tablets by mouth daily.   metoprolol (TOPROL-XL) 200 MG 24 hr tablet Take 200 mg by mouth daily. 1/2 tab daily   MULTIPLE VITAMIN PO Take 1 tablet by mouth daily.   oxyCODONE-acetaminophen (PERCOCET) 10-325 MG tablet Take 1 tablet by mouth daily as needed for pain.     Cardiac Studies:   Echocardiogram 02/24/2018: Left ventricle cavity is normal in size. Mild concentric hypertrophy of the left ventricle. Normal global wall motion. Normal diastolic filling pattern. Calculated EF 63%. Mild tricuspid regurgitation. Estimated pulmonary artery systolic pressure 36 mmHg.  Lexiscan  myoview stress test 02/24/2018: 1. Lexiscan stress test was performed. Exercise capacity was not assessed. Stress symptoms included chest pain, dizziness, headache, and palpitations. Peak effect blood pressure was 142/80 mmHg. Stress EKG is non diagnostic for ischemia as it is a pharmacologic stress. In addition, the stress electrocardiogram showed sinus tachycardia, normal stress conduction, frequent PVC's, and normal stress repolarization.  2. The overall quality of the study is good. There is no evidence of abnormal lung activity. Stress and rest SPECT images demonstrate homogeneous tracer distribution throughout the myocardium. Gated SPECT imaging reveals normal myocardial thickening and wall motion. The left ventricular ejection fraction was normal (66%).  3. Intermediate risk study due to frequent PVC's seen through out the study. Clinical correlation recommendation.  30 day event monitor 02/14/2018-03/15/2018: Normal sinus rhythm.  Symptoms correlated with normal sinus rhythm with occasional PVCs. 4 episodes were sinus rhythm with trigeminal PVC's.  No A. fib or SVT occurred.  Assessment:     ICD-10-CM   1. PVC  (premature ventricular contraction)  I49.3 EKG 12-Lead  2. Venous insufficiency  I87.2   3. Essential hypertension  I10   4. Dyspnea on exertion  R06.09   5. Claudication (HCC)  I73.9 PCV ANKLE BRACHIAL INDEX (ABI)  6. Tobacco use disorder  F17.200     EKG 09/15/2018: Normal sinus rhythm at 61 bpm, normal axis, no evidence of ischemia.    Recommendations:   Patient has had worsening leg edema over the last 6 months and now has bilateral increased pigmentation. Suspect his leg edema is related to venous insufficiency. I have prescribed support stockings to help with this. Dyspnea has remained unchanged. He is on lasix as needed, encouraged him to use daily for the next 3 days. Continue with low sodium diet and leg elevation.   He is not noted to have frequent PVC's on EKG today, but does have occasional on auscultation. He is on high dose beta blocker. He is having heart racing with pushing himself at the gym. Suspect that this may be related to deconditioning. He has previously not been exercising, and has just recently started this. I have encouraged him to obtain heart rate monitor or watch to also help with this. No chest pain. Blood pressure is well controlled.  He does mention pain/ tightness in his legs with walking that resolves with resting. He is a current tobacco user, will obtain ABI for evaluation. I have encouraged him to continue to work to quit smoking. He has been able to cut back on his own in the past. I will continue to monitor his progress. Also advised him to cut back on his alcohol intake. Discussed cardiovascular effects from heavy alcohol use. I will see him back in 8 weeks for follow up.  Miquel Dunn, MSN, APRN, FNP-C [facility]. [place] Office: [phone] Fax: [phone]

## 2018-09-16 ENCOUNTER — Encounter: Payer: Self-pay | Admitting: Cardiology

## 2018-10-09 ENCOUNTER — Other Ambulatory Visit: Payer: Medicaid Other

## 2018-10-10 ENCOUNTER — Other Ambulatory Visit: Payer: Medicaid Other

## 2018-11-10 ENCOUNTER — Ambulatory Visit: Payer: Medicaid Other | Admitting: Cardiology

## 2018-11-10 NOTE — Progress Notes (Deleted)
Primary Physician:  Roger KillWilliams, Breejante J, PA-C   Patient ID: Kyle Mahoney, male    DOB: 1970-03-10, 48 y.o.   MRN: [id number]  Subjective:    No chief complaint on file.   HPI: Kyle Mahoney  is a 48 y.o. male  with hypertension, tobacco use disorder initially evaluated by us for palpitations, chest pain, and leg edema. He underwent lexiscan nuclear stress test in Jan 2020 that showed normal perfusion, frequent PVC's; therefore, considered intermediate risk study. Echocardiogram revealed normal LVEF. Had occasional PVC's on 30 day event monitor.   Since last seen by me 6 months ago, he has had worsening leg edema. Initially felt to be related to amlodipine; however, has continued despite discontinuing this. Leg edema improves with leg elevation and avoiding salt. He has now noticed brown spots on his legs. He does have some tightness and pain in his legs with walking long distances.   He has not noticed any chest discomfort. He does have dyspnea with over exertion that is unchanged. He has started exercising at the gym and has notice heart racing with this and pounding sensation. He has not noticed palpitations outside of trying to exercise. Symptoms resolve with stopping to rest.   He does continue to smoke 0.5 packs per day. Admits to drinking more alcohol recently. States that he has recently had some personal stress that has contributed to this. 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    Past Medical History:  Diagnosis Date  . Hypertension     No past surgical history on file.  Social History   Socioeconomic History  . Marital status: Single    Spouse name: Not on file  . Number of children: 2  . Years of education: Not on file  . Highest education level: Not on file  Occupational History  . Not on file  Social Needs  . Financial resource strain: Not on file  . Food insecurity    Worry:  Not on file    Inability: Not on file  . Transportation needs    Medical: Not on file    Non-medical: Not on file  Tobacco Use  . Smoking status: Current Every Day Smoker    Packs/day: 0.50    Types: Cigarettes  . Smokeless tobacco: Never Used  Substance and Sexual Activity  . Alcohol use: Yes    Comment: occ  . Drug use: Never  . Sexual activity: Not on file  Lifestyle  . Physical activity    Days per week: Not on file    Minutes per session: Not on file  . Stress: Not on file  Relationships  . Social Musicianconnections    Talks on phone: Not on file    Gets together: Not on file    Attends religious service: Not on file    Active member of club or organization: Not on file    Attends meetings of clubs or organizations: Not on file    Relationship status: Not on file  . Intimate partner violence    Fear of current or ex partner: Not on file    Emotionally abused: Not on file    Physically abused: Not on file    Forced sexual activity: Not on file  Other Topics Concern  . Not on file  Social History Narrative  . Not on file    Review of Systems  Constitution: Negative  for decreased appetite, malaise/fatigue, weight gain and weight loss.  Eyes: Negative for visual disturbance.  Cardiovascular: Positive for leg swelling (occasional) and palpitations (occasional). Negative for chest pain, claudication, orthopnea and syncope.  Respiratory: Positive for shortness of breath. Negative for hemoptysis and wheezing.   Endocrine: Negative for cold intolerance and heat intolerance.  Hematologic/Lymphatic: Does not bruise/bleed easily.  Skin: Negative for nail changes.  Musculoskeletal: Negative for muscle weakness and myalgias.  Gastrointestinal: Negative for abdominal pain, change in bowel habit, nausea and vomiting.  Neurological: Negative for difficulty with concentration, dizziness, focal weakness and headaches.  Psychiatric/Behavioral: Negative for altered mental status and  suicidal ideas.  All other systems reviewed and are negative.     Objective:  There were no vitals taken for this visit. There is no height or weight on file to calculate BMI.    Physical Exam  Constitutional: He is oriented to person, place, and time. Vital signs are normal. He appears well-developed and well-nourished.  HENT:  Head: Normocephalic and atraumatic.  Neck: Normal range of motion.  Cardiovascular: Normal rate, regular rhythm, normal heart sounds and intact distal pulses.  Occasional extrasystoles are present.  2+ leg edema bilateral Increased pigmentation  Pulmonary/Chest: Effort normal and breath sounds normal. No accessory muscle usage. No respiratory distress.  Abdominal: Soft. Bowel sounds are normal.  Musculoskeletal: Normal range of motion.  Neurological: He is alert and oriented to person, place, and time.  Skin: Skin is warm and dry.  Vitals reviewed.  Radiology: No results found.  Laboratory examination:    CMP Latest Ref Rng & Units 02/01/2018  Glucose 70 - 99 mg/dL 124(H)  BUN 6 - 20 mg/dL 13  Creatinine 0.61 - 1.24 mg/dL 1.06  Sodium 135 - 145 mmol/L 140  Potassium 3.5 - 5.1 mmol/L 4.0  Chloride 98 - 111 mmol/L 104  CO2 22 - 32 mmol/L 27  Calcium 8.9 - 10.3 mg/dL 9.5  Total Protein 6.5 - 8.1 g/dL 6.8  Total Bilirubin 0.3 - 1.2 mg/dL 0.8  Alkaline Phos 38 - 126 U/L 110  AST 15 - 41 U/L 24  ALT 0 - 44 U/L 30   CBC Latest Ref Rng & Units 02/01/2018  WBC 4.0 - 10.5 K/uL 8.7  Hemoglobin 13.0 - 17.0 g/dL 16.4  Hematocrit 39.0 - 52.0 % 48.3  Platelets 150 - 400 K/uL 236   Lipid Panel  No results found for: CHOL, TRIG, HDL, CHOLHDL, VLDL, LDLCALC, LDLDIRECT HEMOGLOBIN A1C No results found for: HGBA1C, MPG TSH No results for input(s): TSH in the last 8760 hours.  PRN Meds:. There are no discontinued medications. No outpatient medications have been marked as taking for the 11/10/18 encounter (Appointment) with Miquel Dunn, NP.     Cardiac Studies:   Echocardiogram 02/24/2018: Left ventricle cavity is normal in size. Mild concentric hypertrophy of the left ventricle. Normal global wall motion. Normal diastolic filling pattern. Calculated EF 63%. Mild tricuspid regurgitation. Estimated pulmonary artery systolic pressure 36 mmHg.  Lexiscan myoview stress test 02/24/2018: 1. Lexiscan stress test was performed. Exercise capacity was not assessed. Stress symptoms included chest pain, dizziness, headache, and palpitations. Peak effect blood pressure was 142/80 mmHg. Stress EKG is non diagnostic for ischemia as it is a pharmacologic stress. In addition, the stress electrocardiogram showed sinus tachycardia, normal stress conduction, frequent PVC's, and normal stress repolarization.  2. The overall quality of the study is good. There is no evidence of abnormal lung activity. Stress and rest SPECT images demonstrate homogeneous tracer  distribution throughout the myocardium. Gated SPECT imaging reveals normal myocardial thickening and wall motion. The left ventricular ejection fraction was normal (66%).  3. Intermediate risk study due to frequent PVC's seen through out the study. Clinical correlation recommendation.  30 day event monitor 02/14/2018-03/15/2018: Normal sinus rhythm.  Symptoms correlated with normal sinus rhythm with occasional PVCs. 4 episodes were sinus rhythm with trigeminal PVC's.  No A. fib or SVT occurred.  Assessment:   No diagnosis found.  EKG 09/15/2018: Normal sinus rhythm at 61 bpm, normal axis, no evidence of ischemia.    Recommendations:   Patient has had worsening leg edema over the last 6 months and now has bilateral increased pigmentation. Suspect his leg edema is related to venous insufficiency. I have prescribed support stockings to help with this. Dyspnea has remained unchanged. He is on lasix as needed, encouraged him to use daily for the next 3 days. Continue with low sodium diet and leg  elevation.   He is not noted to have frequent PVC's on EKG today, but does have occasional on auscultation. He is on high dose beta blocker. He is having heart racing with pushing himself at the gym. Suspect that this may be related to deconditioning. He has previously not been exercising, and has just recently started this. I have encouraged him to obtain heart rate monitor or watch to also help with this. No chest pain. Blood pressure is well controlled.  He does mention pain/ tightness in his legs with walking that resolves with resting. He is a current tobacco user, will obtain ABI for evaluation. I have encouraged him to continue to work to quit smoking. He has been able to cut back on his own in the past. I will continue to monitor his progress. Also advised him to cut back on his alcohol intake. Discussed cardiovascular effects from heavy alcohol use. I will see him back in 8 weeks for follow up.  Toniann Fail, MSN, APRN, FNP-C [facility]. [place] Office: [phone] Fax: [phone]

## 2018-11-19 ENCOUNTER — Other Ambulatory Visit: Payer: Medicaid Other

## 2018-11-26 ENCOUNTER — Ambulatory Visit: Payer: Medicaid Other | Admitting: Cardiology

## 2019-02-27 ENCOUNTER — Telehealth: Payer: Self-pay | Admitting: Cardiology

## 2019-04-03 ENCOUNTER — Other Ambulatory Visit: Payer: Self-pay | Admitting: Cardiology

## 2019-04-03 DIAGNOSIS — I493 Ventricular premature depolarization: Secondary | ICD-10-CM

## 2019-08-18 ENCOUNTER — Emergency Department (HOSPITAL_COMMUNITY)
Admission: EM | Admit: 2019-08-18 | Discharge: 2019-08-18 | Disposition: A | Discharge status: Home / Self Care | Payer: Medicaid Other | Attending: Emergency Medicine | Admitting: Emergency Medicine

## 2019-08-18 ENCOUNTER — Encounter (HOSPITAL_COMMUNITY): Payer: Self-pay | Admitting: Emergency Medicine

## 2019-08-18 ENCOUNTER — Other Ambulatory Visit: Payer: Self-pay

## 2019-08-18 DIAGNOSIS — K068 Other specified disorders of gingiva and edentulous alveolar ridge: Secondary | ICD-10-CM | POA: Diagnosis not present

## 2019-08-18 DIAGNOSIS — R6884 Jaw pain: Secondary | ICD-10-CM | POA: Diagnosis not present

## 2019-08-18 DIAGNOSIS — Z79899 Other long term (current) drug therapy: Secondary | ICD-10-CM | POA: Diagnosis not present

## 2019-08-18 DIAGNOSIS — I10 Essential (primary) hypertension: Secondary | ICD-10-CM | POA: Diagnosis not present

## 2019-08-18 DIAGNOSIS — Z87891 Personal history of nicotine dependence: Secondary | ICD-10-CM | POA: Insufficient documentation

## 2019-08-18 DIAGNOSIS — D689 Coagulation defect, unspecified: Secondary | ICD-10-CM | POA: Diagnosis present

## 2019-08-18 MED ORDER — TRANEXAMIC ACID FOR EPISTAXIS
500.0000 mg | Freq: Once | TOPICAL | Status: AC
  Administered 2019-08-18: 500 mg via TOPICAL

## 2019-08-18 NOTE — ED Provider Notes (Signed)
[facility] EMERGENCY DEPARTMENT Provider Note   CSN: [id number] Arrival date & time: 08/18/19  0256     History Chief Complaint  Patient presents with  . Coagulation Disorder    Kyle Mahoney is a 50 y.o. male.  Multiple teeth pulled earlier today and had persistent bleeding from left upper and lower posterior jaw area.  Has tried pressure and multiple other things but has not stopped.  No lightheadedness or dizziness.  No other associated symptoms.        Past Medical History:  Diagnosis Date  . Hypertension     Patient Active Problem List   Diagnosis Date Noted  . PVC (premature ventricular contraction) 04/12/2018  . Tobacco use disorder 04/12/2018  . Essential hypertension 04/12/2018  . Bilateral leg edema 04/12/2018  . Dyspnea on exertion 04/12/2018  . Chronic neck pain 05/23/2017  . Chronic migraine 05/23/2017    History reviewed. No pertinent surgical history.     No family history on file.  Social History   Tobacco Use  . Smoking status: Former Smoker    Packs/day: 0.50    Types: Cigarettes    Quit date: 07/14/2019    Years since quitting: 0.0  . Smokeless tobacco: Never Used  Vaping Use  . Vaping Use: Never used  Substance Use Topics  . Alcohol use: Yes    Comment: occ  . Drug use: Never    Home Medications Prior to Admission medications   Medication Sig Start Date End Date Taking? Authorizing Provider  furosemide (LASIX) 20 MG tablet Take 1-2 tablets by mouth daily. 03/10/18   [provider]  metoprolol (TOPROL-XL) 200 MG 24 hr tablet Take 200 mg by mouth daily. 1/2 tab daily 09/04/17   [provider]  MULTIPLE VITAMIN PO Take 1 tablet by mouth daily.    [provider]  oxyCODONE-acetaminophen (PERCOCET) 10-325 MG tablet Take 1 tablet by mouth daily as needed for pain.  08/21/17   [provider]    Allergies    Patient has no known allergies.  Review of Systems   Review of Systems  All other  systems reviewed and are negative.   Physical Exam Updated Vital Signs BP 105/64   Pulse (!) 102   Resp 18   Ht 6' (1.829 m)   Wt 90.7 kg   SpO2 100%   BMI 27.12 kg/m   Physical Exam Vitals and nursing note reviewed.  Constitutional:      Appearance: He is well-developed.  HENT:     Head: Normocephalic and atraumatic.     Nose: No congestion or rhinorrhea.     Mouth/Throat:     Comments: Bleeding from sockets of what appears to be left upper second molar and bottom molar (unsure which) with some clots involved Eyes:     Pupils: Pupils are equal, round, and reactive to light.  Cardiovascular:     Rate and Rhythm: Normal rate.  Pulmonary:     Effort: Pulmonary effort is normal. No respiratory distress.  Abdominal:     General: There is no distension.  Musculoskeletal:        General: Normal range of motion.     Cervical back: Normal range of motion.  Neurological:     Mental Status: He is alert.     ED Results / Procedures / Treatments   Labs (all labs ordered are listed, but only abnormal results are displayed) Labs Reviewed - No data to display  EKG None  Radiology  No results found.  Procedures Procedures (including critical care time)  Medications Ordered in ED Medications  tranexamic acid (CYKLOKAPRON) 1000 MG/10ML topical solution 500 mg (500 mg Topical Given 08/18/19 0440)    ED Course  I have reviewed the triage vital signs and the nursing notes.  Pertinent labs & imaging results that were available during my care of the patient were reviewed by me and considered in my medical decision making (see chart for details).    MDM Rules/Calculators/A&P                          Post tooth extraction oral bleeding. Will attempt to stop. No e/o acute blood loss anemia at this time.   Tea bags didn't work. txa soaked gauze and pressure did. Observed for about 2 hours with hemostasis achieved.  Final Clinical Impression(s) / ED Diagnoses Final diagnoses:    Gums, bleeding    Rx / DC Orders ED Discharge Orders    None       Doil Kamara, Barbara Cower, MD 08/18/19 [phone]

## 2019-08-18 NOTE — ED Triage Notes (Signed)
Pt states he had all his teeth pulled yesterday and has been bleeding since procedure.

## 2020-04-23 ENCOUNTER — Emergency Department (HOSPITAL_COMMUNITY)
Admission: EM | Admit: 2020-04-23 | Discharge: 2020-04-23 | Discharge status: Against Medical Advice | Payer: Medicaid Other | Attending: Emergency Medicine | Admitting: Emergency Medicine

## 2020-04-23 ENCOUNTER — Encounter (HOSPITAL_COMMUNITY): Payer: Self-pay | Admitting: Emergency Medicine

## 2020-04-23 ENCOUNTER — Other Ambulatory Visit: Payer: Self-pay

## 2020-04-23 ENCOUNTER — Emergency Department (HOSPITAL_COMMUNITY): Payer: Medicaid Other

## 2020-04-23 DIAGNOSIS — R202 Paresthesia of skin: Secondary | ICD-10-CM | POA: Insufficient documentation

## 2020-04-23 DIAGNOSIS — I10 Essential (primary) hypertension: Secondary | ICD-10-CM | POA: Diagnosis not present

## 2020-04-23 DIAGNOSIS — R4020 Unspecified coma: Secondary | ICD-10-CM

## 2020-04-23 DIAGNOSIS — R519 Headache, unspecified: Secondary | ICD-10-CM | POA: Diagnosis not present

## 2020-04-23 DIAGNOSIS — F17210 Nicotine dependence, cigarettes, uncomplicated: Secondary | ICD-10-CM | POA: Diagnosis not present

## 2020-04-23 DIAGNOSIS — R55 Syncope and collapse: Secondary | ICD-10-CM | POA: Diagnosis not present

## 2020-04-23 DIAGNOSIS — R110 Nausea: Secondary | ICD-10-CM | POA: Diagnosis not present

## 2020-04-23 DIAGNOSIS — R42 Dizziness and giddiness: Secondary | ICD-10-CM | POA: Insufficient documentation

## 2020-04-23 DIAGNOSIS — Z79899 Other long term (current) drug therapy: Secondary | ICD-10-CM | POA: Diagnosis not present

## 2020-04-23 HISTORY — DX: Pneumothorax, unspecified: J93.9

## 2020-04-23 HISTORY — DX: Other injury of unspecified body region, initial encounter: T14.8XXA

## 2020-04-23 LAB — CBC WITH DIFFERENTIAL/PLATELET
Abs Immature Granulocytes: 0.01 10*3/uL (ref 0.00–0.07)
Basophils Absolute: 0 10*3/uL (ref 0.0–0.1)
Basophils Relative: 0 %
Eosinophils Absolute: 0.1 10*3/uL (ref 0.0–0.5)
Eosinophils Relative: 1 %
HCT: 45.8 % (ref 39.0–52.0)
Hemoglobin: 14.9 g/dL (ref 13.0–17.0)
Immature Granulocytes: 0 %
Lymphocytes Relative: 24 %
Lymphs Abs: 1.9 10*3/uL (ref 0.7–4.0)
MCH: 28.9 pg (ref 26.0–34.0)
MCHC: 32.5 g/dL (ref 30.0–36.0)
MCV: 88.9 fL (ref 80.0–100.0)
Monocytes Absolute: 0.5 10*3/uL (ref 0.1–1.0)
Monocytes Relative: 7 %
Neutro Abs: 5.4 10*3/uL (ref 1.7–7.7)
Neutrophils Relative %: 68 %
Platelets: 234 10*3/uL (ref 150–400)
RBC: 5.15 MIL/uL (ref 4.22–5.81)
RDW: 12.3 % (ref 11.5–15.5)
WBC: 8 10*3/uL (ref 4.0–10.5)
nRBC: 0 % (ref 0.0–0.2)

## 2020-04-23 LAB — COMPREHENSIVE METABOLIC PANEL
ALT: 18 U/L (ref 0–44)
AST: 16 U/L (ref 15–41)
Albumin: 4.2 g/dL (ref 3.5–5.0)
Alkaline Phosphatase: 127 U/L — ABNORMAL HIGH (ref 38–126)
Anion gap: 8 (ref 5–15)
BUN: 20 mg/dL (ref 6–20)
CO2: 29 mmol/L (ref 22–32)
Calcium: 9 mg/dL (ref 8.9–10.3)
Chloride: 103 mmol/L (ref 98–111)
Creatinine, Ser: 0.92 mg/dL (ref 0.61–1.24)
GFR, Estimated: >60 mL/min (ref >60)
Glucose, Bld: 98 mg/dL (ref 70–99)
Potassium: 4.3 mmol/L (ref 3.5–5.1)
Sodium: 140 mmol/L (ref 135–145)
Total Bilirubin: 0.6 mg/dL (ref 0.3–1.2)
Total Protein: 7 g/dL (ref 6.5–8.1)

## 2020-04-23 LAB — CBG MONITORING, ED: Glucose-Capillary: 81 mg/dL (ref 70–99)

## 2020-04-23 LAB — ETHANOL: Alcohol, Ethyl (B): <10 mg/dL (ref <10)

## 2020-04-23 MED ORDER — IOHEXOL 350 MG/ML SOLN
100.0000 mL | Freq: Once | INTRAVENOUS | Status: AC | PRN
  Administered 2020-04-23: 75 mL via INTRAVENOUS

## 2020-04-23 NOTE — ED Notes (Signed)
Pt educated on need for UA pt states he is unable to give UA at this time.

## 2020-04-23 NOTE — Discharge Instructions (Addendum)
You have been seen and discharged from the emergency department.  We have concern that you could have experienced a stroke or seizure.  Our recommendation is that you get admitted to the hospital for evaluation with an MRI and an EEG however you declined and wished to leave the hospital now.  Follow-up with your primary provider for reevaluation.  They may be able to order the MRI and EEG.  Until you are medically cleared to not do any dangerous activity alone including take baths, swim, drive.  In the event that you would have another seizure this could cause harm to you and others.  You have chosen to leave the emergency department AGAINST MEDICAL ADVICE.  I have explained to you your testing results and the need for further evaluation/admission.  You have verbalized understanding of these results and plan and are choosing to leave the hospital AGAINST MEDICAL ADVICE. You understand the risks associated with leaving without further evaluation/treatment. If you have any worsening symptoms or choose to be treated please return immediately to emergency department.
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
## 2020-04-23 NOTE — ED Notes (Signed)
Pt requesting to leave AMA. Provider aware.

## 2020-04-23 NOTE — ED Notes (Signed)
Pt given information on follow up signed AMA papers,  IV Dc'ed.

## 2020-04-23 NOTE — ED Notes (Signed)
Assumed care of pt at 1100.  Pt resting call bell in reach.

## 2020-04-25 ENCOUNTER — Emergency Department (HOSPITAL_COMMUNITY)
Admission: EM | Admit: 2020-04-25 | Discharge: 2020-04-25 | Disposition: A | Discharge status: Home / Self Care | Payer: Medicaid Other | Attending: Emergency Medicine | Admitting: Emergency Medicine

## 2020-04-25 ENCOUNTER — Other Ambulatory Visit: Payer: Self-pay

## 2020-04-25 ENCOUNTER — Encounter (HOSPITAL_COMMUNITY): Payer: Self-pay

## 2020-04-25 DIAGNOSIS — R202 Paresthesia of skin: Secondary | ICD-10-CM | POA: Diagnosis not present

## 2020-04-25 DIAGNOSIS — F17210 Nicotine dependence, cigarettes, uncomplicated: Secondary | ICD-10-CM | POA: Diagnosis not present

## 2020-04-25 DIAGNOSIS — R55 Syncope and collapse: Secondary | ICD-10-CM | POA: Insufficient documentation

## 2020-04-25 DIAGNOSIS — W07XXXA Fall from chair, initial encounter: Secondary | ICD-10-CM | POA: Insufficient documentation

## 2020-04-25 DIAGNOSIS — R42 Dizziness and giddiness: Secondary | ICD-10-CM

## 2020-04-25 DIAGNOSIS — I10 Essential (primary) hypertension: Secondary | ICD-10-CM | POA: Diagnosis not present

## 2020-04-25 DIAGNOSIS — R404 Transient alteration of awareness: Secondary | ICD-10-CM

## 2020-04-25 DIAGNOSIS — R110 Nausea: Secondary | ICD-10-CM | POA: Insufficient documentation

## 2020-04-25 DIAGNOSIS — R4189 Other symptoms and signs involving cognitive functions and awareness: Secondary | ICD-10-CM

## 2020-04-25 NOTE — Discharge Instructions (Signed)
You were seen in the emergency department for evaluation of a dizzy and unresponsive episode that occurred a few days ago.  This will need close follow-up with neurology as an outpatient.  We have put a referral back into [facility] neurology who you have seen in the past.  You should not drive until cleared by neurology.  Please return to the emergency department if any worsening or concerning symptoms
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
## 2022-02-26 IMAGING — CT CT ANGIO HEAD
1 of 12 series · 5 of 34 positions shown · IV contrast (Omnipaque or Isovue)
Comparison: Face CT 08/25/2006.

CLINICAL DATA: 49-year-old male with syncope, dizziness. Right side
headache.

EXAM:
CT ANGIOGRAPHY HEAD AND NECK
TECHNIQUE: Multidetector CT imaging of the head and neck was performed using
the standard protocol during bolus administration of intravenous
contrast. Multiplanar CT image reconstructions and MIPs were
obtained to evaluate the vascular anatomy. Carotid stenosis
measurements (when applicable) are obtained utilizing NASCET
criteria, using the distal internal carotid diameter as the
denominator.
CONTRAST:  75mL OMNIPAQUE IOHEXOL 350 MG/ML SOLN

[Series 8: ax thins · axial · 0.40mm/px · z∈[-173,+80]mm · 5 of 371 slices shown]
[im 62/371  soft-tissue]
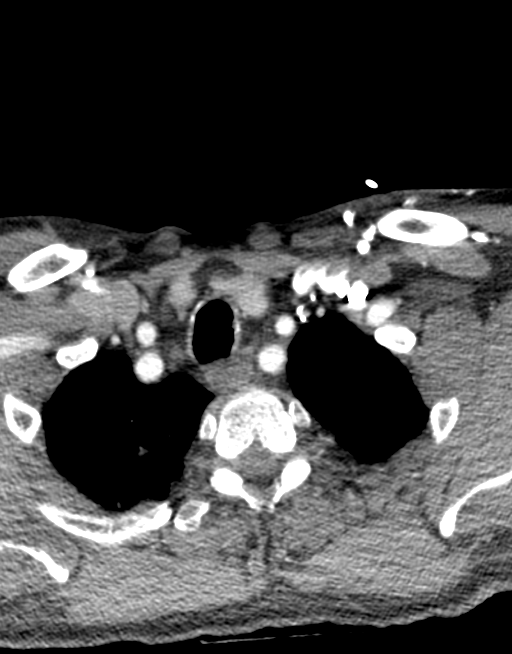
[im 124/371  bone]
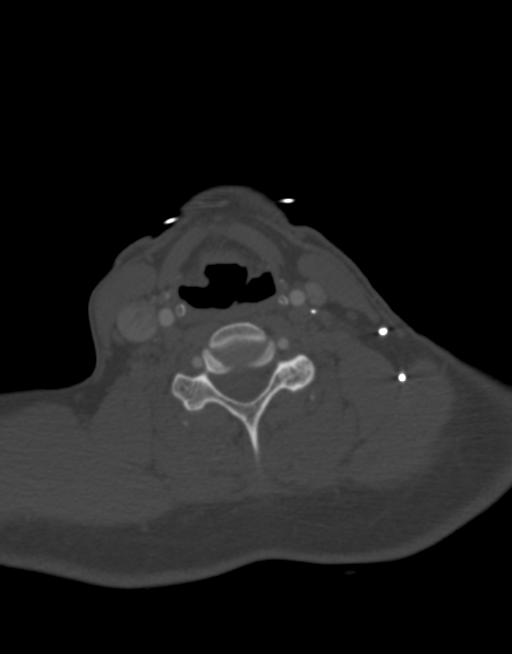
[im 186/371  soft-tissue]
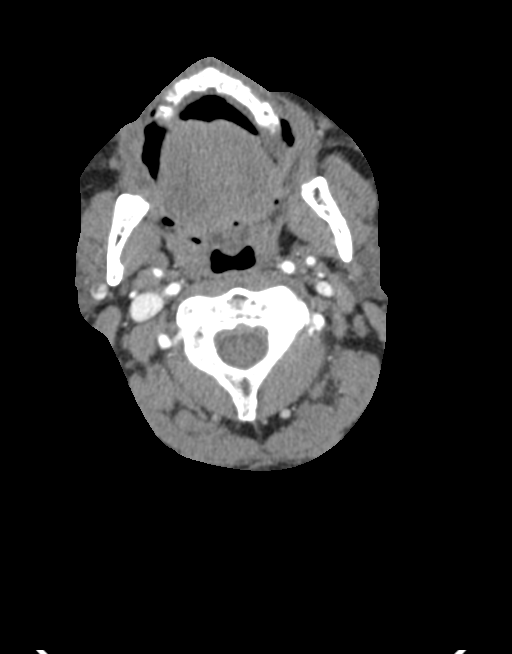
[im 247/371  bone]
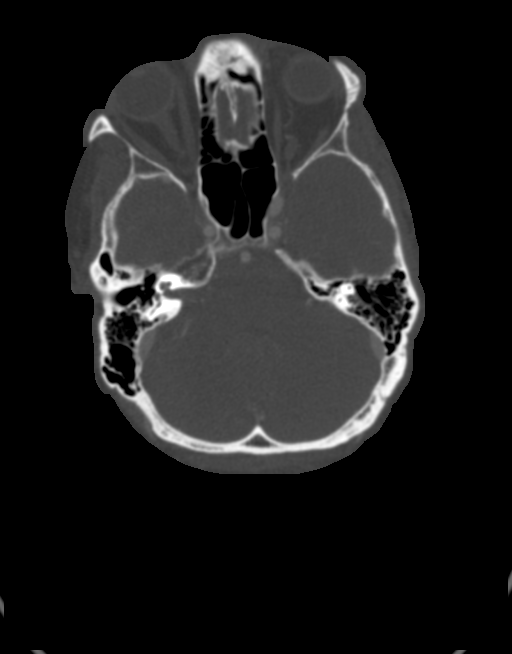
[im 309/371  soft-tissue]
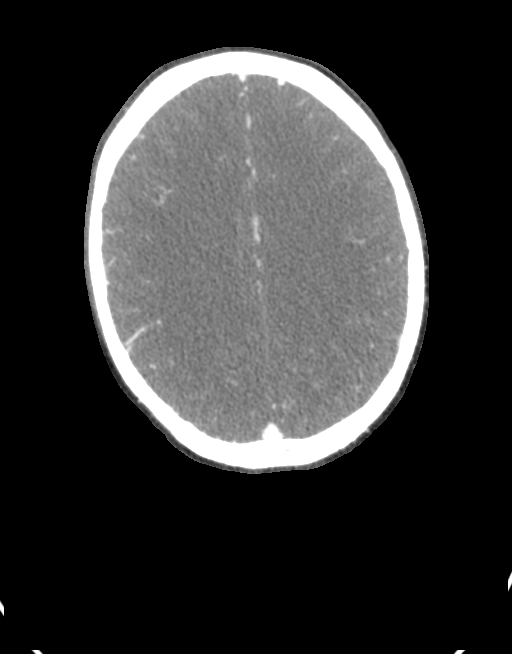

[5 of 34 positions shown; findings below may reference images not displayed]

FINDINGS: CT HEAD

Brain: No midline shift, ventriculomegaly, mass effect, evidence of
mass lesion, intracranial hemorrhage or evidence of cortically based
acute infarction. Gray-white matter differentiation is within normal
limits throughout the brain. No encephalomalacia identified.

Calvarium and skull base: Negative.

Paranasal sinuses: Visualized paranasal sinuses and mastoids are
clear. Tympanic cavities are clear.

Orbits: Visualized orbits and scalp soft tissues are within normal
limits.

CTA NECK

Skeleton: Absent dentition now. No acute osseous abnormality
identified.

Upper chest: Patchy ground-glass but also small 11 mm mildly
spiculated area in the right lung apex (series 6, image 157).
Otherwise negative visible lungs. No superior mediastinal
lymphadenopathy. Small volume adherent retained secretions in the
trachea.

Other neck: Negative.

Aortic arch: 3 vessel arch configuration.  No arch atherosclerosis.

Right carotid system: Negative aside from mild tortuosity.

Left carotid system: Negative aside from mild tortuosity.

Vertebral arteries:
Negative.

CTA HEAD

Posterior circulation: Codominant and normal distal vertebral
arteries to the basilar. Normal PICA origins. Patent basilar artery
without stenosis. Normal SCA and PCA origins. Posterior
communicating arteries are diminutive or absent. Normal bilateral
PCA branches.

Anterior circulation: Both ICA siphons are patent with minimal
calcified plaque and no stenosis. Patent carotid termini. Normal MCA
and ACA origins. Mildly tortuous A1 segments. Normal anterior
communicating artery. Normal bilateral ACA branches. Normal
bilateral MCA M1 segments and MCA branches.

Venous sinuses: Patent.

Anatomic variants: None.

Review of the MIP images confirms the above findings
IMPRESSION: 1. Negative CTA head and neck aside from mild vessel tortuosity.
Normal CT appearance of the brain.

2. Small area of patchy and spiculated opacity in the right lung
apex is indeterminate and will require CT surveillance. Recommend
non emergent follow-up Chest CT (noncontrast should suffice).

## 2022-08-05 DIAGNOSIS — M1990 Unspecified osteoarthritis, unspecified site: Secondary | ICD-10-CM | POA: Diagnosis not present

## 2022-08-05 DIAGNOSIS — F1021 Alcohol dependence, in remission: Secondary | ICD-10-CM | POA: Diagnosis not present

## 2022-08-05 DIAGNOSIS — Z5982 Transportation insecurity: Secondary | ICD-10-CM | POA: Diagnosis not present

## 2022-08-05 DIAGNOSIS — Z5986 Financial insecurity: Secondary | ICD-10-CM | POA: Diagnosis not present

## 2022-08-05 DIAGNOSIS — Z720 Tobacco use: Secondary | ICD-10-CM | POA: Diagnosis not present

## 2022-08-05 DIAGNOSIS — I499 Cardiac arrhythmia, unspecified: Secondary | ICD-10-CM | POA: Diagnosis not present

## 2022-08-05 DIAGNOSIS — F32A Depression, unspecified: Secondary | ICD-10-CM | POA: Diagnosis not present
